# Patient Record
Sex: Male | Born: 1995 | Race: Black or African American | Hispanic: No | Marital: Single | Smoking: Former smoker
Health system: Southern US, Community
[De-identification: ages and names within clinical notes are randomized; demographics above are authoritative.]

---
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
## 2016-10-15 ENCOUNTER — Emergency Department
Admission: EM | Admit: 2016-10-15 | Discharge: 2016-10-15 | Disposition: A | Discharge status: Home / Self Care | Payer: Self-pay | Attending: Emergency Medicine | Admitting: Emergency Medicine

## 2016-10-15 DIAGNOSIS — J111 Influenza due to unidentified influenza virus with other respiratory manifestations: Secondary | ICD-10-CM | POA: Insufficient documentation

## 2016-10-15 MED ORDER — GUAIFENESIN-CODEINE 100-10 MG/5ML PO SOLN: 10.0000 mL | ORAL | 0 refills | Status: DC | PRN

## 2016-10-15 MED ORDER — IBUPROFEN 600 MG PO TABS: 600.0000 mg | ORAL_TABLET | Freq: Four times a day (QID) | ORAL | 0 refills | Status: DC | PRN

## 2016-10-15 MED ORDER — OSELTAMIVIR PHOSPHATE 75 MG PO CAPS: 75.0000 mg | ORAL_CAPSULE | Freq: Two times a day (BID) | ORAL | 0 refills | Status: DC

## 2016-10-15 MED ORDER — ACETAMINOPHEN 325 MG PO TABS
650.0000 mg | ORAL_TABLET | Freq: Once | ORAL | Status: AC | PRN
  Administered 2016-10-15: 650 mg via ORAL
  Filled 2016-10-15: qty 2

## 2016-10-15 MED ORDER — CHLORPHENIRAMINE MALEATE 4 MG PO TABS: 4.0000 mg | ORAL_TABLET | Freq: Two times a day (BID) | ORAL | 0 refills | Status: DC | PRN

## 2016-10-15 NOTE — ED Notes (Signed)
Pt verbalized understanding of discharge instructions. NAD at this time. 

## 2016-10-15 NOTE — ED Provider Notes (Signed)
[facility] Emergency Department Provider Note  ____________________________________________   None    (approximate)  I have reviewed the triage vital signs and the nursing notes.   HISTORY  Chief Complaint Cough and Fatigue   HPI Ethan Dennis is a 21 y.o. male, NAD, who presents with a one day history of cough, chills, and fatigue. These symptoms developed last night, in addition to a headache, body aches, dizziness after coughing, and decreased appetite. He has also felt congested for several days. He is has taken nothing for his symptoms. He works in a [employer] where people have been sick, and his friend has been diagnosed with the flu. He did not receive a flu vaccine this year. He denies changes in vision, shortness of breath, difficulty breathing, chest pain, abdominal pain, nausea, vomiting, diarrhea.  History reviewed. No pertinent past medical history.  There are no active problems to display for this patient.   History reviewed. No pertinent surgical history.  Prior to Admission medications   Medication Sig Start Date End Date Taking? Authorizing Provider  chlorpheniramine (CHLOR-TRIMETON) 4 MG tablet Take 1 tablet (4 mg total) by mouth 2 (two) times daily as needed for allergies or rhinitis. 10/15/16   Charmayne Sheerharles M Ameila Weldon, PA-C  guaiFENesin-codeine 100-10 MG/5ML syrup Take 10 mLs by mouth every 4 (four) hours as needed for cough. 10/15/16   Charmayne Sheerharles M Darrelle Barrell, PA-C  ibuprofen (ADVIL,MOTRIN) 600 MG tablet Take 1 tablet (600 mg total) by mouth every 6 (six) hours as needed. 10/15/16   Evangeline Dakinharles M Vang Kraeger, PA-C  oseltamivir (TAMIFLU) 75 MG capsule Take 1 capsule (75 mg total) by mouth 2 (two) times daily. 10/15/16   Evangeline Dakinharles M Xuan Mateus, PA-C    Allergies Patient has no known allergies.  No family history on file.  Social History Social History  Substance Use Topics  . Smoking status: Never Smoker  . Smokeless tobacco: Never Used  . Alcohol use No     Review of Systems  Constitutional: Positive fever, chills, fatigue, decreased appetite.   Eyes: No visual changes. ENT: Positive nasal congestion, left ear pain. No sore throat, ear drainage. Cardiovascular: Denies chest pain. Respiratory: Positive cough, productive of scant, clear mucus. Denies shortness of breath, chest congestion, wheezing. Gastrointestinal: No abdominal pain.  No nausea, no vomiting.  No diarrhea.  No constipation. Genitourinary: Negative for dysuria. Musculoskeletal: Positive for general myalgias. Skin: Negative for rash. Neurological: Positive for frontal headache. Negative for focal weakness or numbness. 10-point ROS otherwise negative.  ____________________________________________   PHYSICAL EXAM:  VITAL SIGNS: ED Triage Vitals  Enc Vitals Group     BP 10/15/16 1453 (!) 162/89     Pulse Rate 10/15/16 1453 99     Resp 10/15/16 1453 18     Temp 10/15/16 1453 (!) 101.1 F (38.4 C)     Temp Source 10/15/16 1453 Oral     SpO2 10/15/16 1453 96 %     Weight 10/15/16 1453 185 lb (83.9 kg)     Height 10/15/16 1453 6' (1.829 m)     Head Circumference --      Peak Flow --      Pain Score 10/15/16 1454 8     Pain Loc --      Pain Edu? --      Excl. in GC? --    Constitutional: Alert and oriented. Well appearing and in no acute distress. Eyes: Conjunctivae are normal and moist. Eyes without icterus or injection. Ears: R ear with  excessive cerumen without impaction. Portion of TM visible is without erythema. L TM visualized and bulging with injection and erythema. Negative for perforation.  Head: Atraumatic. Nose: Positive congestion with clear and bloody mucus. Bilateral turbinates are erythematous and injected.  Mouth/Throat: Mucous membranes are moist.  Oropharynx injected and erythematous. Neck: No stridor.  Hematological/Lymphatic/Immunilogical: No cervical lymphadenopathy. Cardiovascular: Normal rate, regular rhythm. Grossly normal heart sounds.   Good peripheral circulation. Respiratory: Normal respiratory effort.  No retractions. Lungs CTAB with no wheeze, rhonchi, or rales. Gastrointestinal: Soft and nontender. No distention. Musculoskeletal: No lower extremity tenderness nor edema.  No joint effusions. Neurologic:  Normal speech and language. No gross focal neurologic deficits are appreciated. No gait instability. Skin:  Skin is warm, moist, and intact. Skin turgor is normal. No rash noted. Psychiatric: Mood and affect are normal. Speech and behavior are normal.  ____________________________________________   LABS (all labs ordered are listed, but only abnormal results are displayed)  Labs Reviewed - No data to display __________________________________    PROCEDURES  Procedure(s) performed: None  Procedures  Critical Care performed: No  ____________________________________________   INITIAL IMPRESSION / ASSESSMENT AND PLAN / ED COURSE  Pertinent labs & imaging results that were available during my care of the patient were reviewed by me and considered in my medical decision making (see chart for details).   Influenza-like symptoms. Rx given for chlorpheniramine, guaifenesin-codeine syrup, ibuprofen, oseltamivir. Increase po fluids. No work until fever-free x 24 hours. Follow up with PCP as needed.    ____________________________________________   FINAL CLINICAL IMPRESSION(S) / ED DIAGNOSES  Final diagnoses:  Influenza      NEW MEDICATIONS STARTED DURING THIS VISIT:  Discharge Medication List as of 10/15/2016  4:38 PM    START taking these medications   Details  chlorpheniramine (CHLOR-TRIMETON) 4 MG tablet Take 1 tablet (4 mg total) by mouth 2 (two) times daily as needed for allergies or rhinitis., Starting Sat 10/15/2016, Print    guaiFENesin-codeine 100-10 MG/5ML syrup Take 10 mLs by mouth every 4 (four) hours as needed for cough., Starting Sat 10/15/2016, Print    ibuprofen (ADVIL,MOTRIN) 600 MG  tablet Take 1 tablet (600 mg total) by mouth every 6 (six) hours as needed., Starting Sat 10/15/2016, Print    oseltamivir (TAMIFLU) 75 MG capsule Take 1 capsule (75 mg total) by mouth 2 (two) times daily., Starting Sat 10/15/2016, Print         Note:  This document was prepared using Dragon voice recognition software and may include unintentional dictation errors.   Evangeline Dakin, PA-C 10/15/16 2345    Jene Every, MD 10/17/16 [phone]

## 2016-10-15 NOTE — ED Notes (Signed)
Pt reports chills, sweats and feet aches x 2 days, Pt also reports productive cough with mucus/ Denies sick contacts and states he did not get a flu shot.

## 2016-10-15 NOTE — ED Triage Notes (Signed)
Pt reports nonproductive cough, chills, and fatigue that began last night. Pt provided mask. Pt alert and oriented X4, active, cooperative, pt in NAD. RR even and unlabored, color WNL.

## 2017-03-17 ENCOUNTER — Emergency Department
Admission: EM | Admit: 2017-03-17 | Discharge: 2017-03-17 | Disposition: A | Discharge status: Home / Self Care | Payer: Self-pay | Attending: Emergency Medicine | Admitting: Emergency Medicine

## 2017-03-17 ENCOUNTER — Emergency Department: Payer: Self-pay

## 2017-03-17 DIAGNOSIS — R519 Headache, unspecified: Secondary | ICD-10-CM

## 2017-03-17 DIAGNOSIS — Z79899 Other long term (current) drug therapy: Secondary | ICD-10-CM | POA: Insufficient documentation

## 2017-03-17 DIAGNOSIS — R51 Headache: Secondary | ICD-10-CM | POA: Insufficient documentation

## 2017-03-17 MED ORDER — DIPHENHYDRAMINE HCL 50 MG/ML IJ SOLN
25.0000 mg | Freq: Once | INTRAMUSCULAR | Status: AC
  Administered 2017-03-17: 25 mg via INTRAVENOUS
  Filled 2017-03-17: qty 1

## 2017-03-17 MED ORDER — METOCLOPRAMIDE HCL 5 MG/ML IJ SOLN
20.0000 mg | Freq: Once | INTRAVENOUS | Status: AC
  Administered 2017-03-17: 20 mg via INTRAVENOUS
  Filled 2017-03-17 (×3): qty 4

## 2017-03-17 MED ORDER — KETOROLAC TROMETHAMINE 30 MG/ML IJ SOLN
30.0000 mg | Freq: Once | INTRAMUSCULAR | Status: AC
  Administered 2017-03-17: 30 mg via INTRAVENOUS
  Filled 2017-03-17: qty 1

## 2017-03-17 MED ORDER — BUTALBITAL-APAP-CAFFEINE 50-325-40 MG PO TABS: 1.0000 | ORAL_TABLET | Freq: Four times a day (QID) | ORAL | 0 refills | Status: DC | PRN

## 2017-03-17 MED ORDER — ONDANSETRON HCL 4 MG/2ML IJ SOLN
4.0000 mg | Freq: Once | INTRAMUSCULAR | Status: AC
Start: 2017-03-17 — End: 2017-03-17
  Administered 2017-03-17: 4 mg via INTRAVENOUS
  Filled 2017-03-17: qty 2

## 2017-03-17 MED ORDER — SODIUM CHLORIDE 0.9 % IV BOLUS (SEPSIS)
1000.0000 mL | Freq: Once | INTRAVENOUS | Status: AC
  Administered 2017-03-17: 1000 mL via INTRAVENOUS

## 2017-03-17 NOTE — ED Provider Notes (Signed)
[facility] Emergency Department Provider Note   ____________________________________________   First MD Initiated Contact with Patient 03/17/17 1146     (approximate)  I have reviewed the triage vital signs and the nursing notes.   HISTORY  Chief Complaint Headache    HPI Ethan Dennis is a 21 y.o. male patient complaining of acute headache quite work today. Patient state there is vertigo associated with the headache. Patient denies  facial weakness but is sensitive to light.. Patient has been a long time since he had headache. This  is the worse he ever experienced.Patient's rate headaches a 9/10. Patient describes his headache as "sharp". No palliative management this complaint.   History reviewed. No pertinent past medical history.  There are no active problems to display for this patient.   History reviewed. No pertinent surgical history.  Prior to Admission medications   Medication Sig Start Date End Date Taking? Authorizing Provider  butalbital-acetaminophen-caffeine (FIORICET, ESGIC) [phone] MG tablet Take 1-2 tablets by mouth every 6 (six) hours as needed for headache. 03/17/17   Joni Reining, PA-C  chlorpheniramine (CHLOR-TRIMETON) 4 MG tablet Take 1 tablet (4 mg total) by mouth 2 (two) times daily as needed for allergies or rhinitis. 10/15/16   Beers, Charmayne Sheer, PA-C  guaiFENesin-codeine 100-10 MG/5ML syrup Take 10 mLs by mouth every 4 (four) hours as needed for cough. 10/15/16   Beers, Charmayne Sheer, PA-C  ibuprofen (ADVIL,MOTRIN) 600 MG tablet Take 1 tablet (600 mg total) by mouth every 6 (six) hours as needed. 10/15/16   Beers, Charmayne Sheer, PA-C  oseltamivir (TAMIFLU) 75 MG capsule Take 1 capsule (75 mg total) by mouth 2 (two) times daily. 10/15/16   Beers, Charmayne Sheer, PA-C    Allergies Patient has no known allergies.  No family history on file.  Social History Social History  Substance Use Topics  . Smoking status: Never Smoker    . Smokeless tobacco: Never Used  . Alcohol use No    Review of Systems  Constitutional: No fever/chills Eyes: No visual changes. Light sensitivity ENT: No sore throat. Cardiovascular: Denies chest pain. Respiratory: Denies shortness of breath. Gastrointestinal: No abdominal pain.  Nausea without vomiting.  No diarrhea.  No constipation. Genitourinary: Negative for dysuria. Musculoskeletal: Negative for back pain. Skin: Negative for rash. Neurological: Positive for headaches, but denies focal weakness or numbness.   ____________________________________________   PHYSICAL EXAM:  VITAL SIGNS: ED Triage Vitals  Enc Vitals Group     BP 03/17/17 1115 (!) 161/98     Pulse Rate 03/17/17 1115 62     Resp 03/17/17 1115 18     Temp 03/17/17 1115 98.4 F (36.9 C)     Temp Source 03/17/17 1115 Oral     SpO2 03/17/17 1115 97 %     Weight 03/17/17 1116 180 lb (81.6 kg)     Height 03/17/17 1116 6' (1.829 m)     Head Circumference --      Peak Flow --      Pain Score 03/17/17 1115 9     Pain Loc --      Pain Edu? --      Excl. in GC? --     Constitutional: Alert and oriented. Well appearing and in no acute distress. Eyes: Photophobic Head: Atraumatic. Nose: No congestion/rhinnorhea. No guarding palpation of maxillary sinuses Mouth/Throat: Mucous membranes are moist.  Oropharynx non-erythematous. Bilateral cerumen Neck: No stridor.  No cervical spine tenderness to palpation. Hematological/Lymphatic/Immunilogical: No cervical lymphadenopathy.  Cardiovascular: Normal rate, regular rhythm. Grossly normal heart sounds.  Good peripheral circulation. Elevated blood pressure Respiratory: Normal respiratory effort.  No retractions. Lungs CTAB. Gastrointestinal: Soft and nontender. No distention. No abdominal bruits. No CVA tenderness. Neurologic:  Normal speech and language. No gross focal neurologic deficits are appreciated. No gait instability. Skin:  Skin is warm, dry and intact. No  rash noted. Psychiatric: Mood and affect are normal. Speech and behavior are normal.  ____________________________________________   LABS (all labs ordered are listed, but only abnormal results are displayed)  Labs Reviewed - No data to display ____________________________________________  EKG   ____________________________________________  RADIOLOGY  Ct Head Wo Contrast  Result Date: 03/17/2017 CLINICAL DATA:  Acute onset severe headache. EXAM: CT HEAD WITHOUT CONTRAST TECHNIQUE: Contiguous axial images were obtained from the base of the skull through the vertex without intravenous contrast. COMPARISON:  None. FINDINGS: Brain: The ventricles are normal in size and configuration. There is no intracranial mass, hemorrhage, extra-axial fluid collection, or midline shift. Gray-white compartments are normal. No acute infarct is appreciable. Vascular: There is no hyperdense vessel. There is no appreciable vascular calcification. Skull: Bony calvarium appears intact. Sinuses/Orbits: There is mucosal thickening and opacity in several ethmoid air cells bilaterally. Other visualized paranasal sinuses are clear. Frontal sinuses are hypoplastic. Visualized orbits appear symmetric bilaterally. Other: Mastoid air cells are clear. There is debris in each external auditory canal, more on the right than on the left. IMPRESSION: No intracranial mass, hemorrhage, or extra-axial fluid collection. Gray-white compartments appear normal. Ethmoid sinus disease bilaterally. Probable cerumen in each external auditory canal. Electronically Signed   By: Bretta BangWilliam  Woodruff III M.D.   On: 03/17/2017 12:22    _Head CT with no acute findings. Patient does have ethmoid sinusitis. ___________________________________________   PROCEDURES  Procedure(s) performed: None  Procedures  Critical Care performed: No  ____________________________________________   INITIAL IMPRESSION / ASSESSMENT AND PLAN / ED  COURSE  Pertinent labs & imaging results that were available during my care of the patient were reviewed by me and considered in my medical decision making (see chart for details).  Acute headache. Discussed CT findings with patient. Patient given IV Toradol, normal saline, Zofran, and Reglan. Status post intervention patient state headache approximately 90% resolved. Patient [facility]. Patient given work note for today.      ____________________________________________   FINAL CLINICAL IMPRESSION(S) / ED DIAGNOSES  Final diagnoses:  Headache disorder      NEW MEDICATIONS STARTED DURING THIS VISIT:  New Prescriptions   BUTALBITAL-ACETAMINOPHEN-CAFFEINE (FIORICET, ESGIC) 50-325-40 MG TABLET    Take 1-2 tablets by mouth every 6 (six) hours as needed for headache.     Note:  This document was prepared using Dragon voice recognition software and may include unintentional dictation errors.    Joni ReiningSmith, Ronald K, PA-C 03/17/17 1357    Sharyn CreamerQuale, Mark, MD 03/17/17 1710

## 2017-03-17 NOTE — ED Notes (Signed)
See triage note  Presents with sudden onset of headache   States he developed headache while at work this a,

## 2017-03-17 NOTE — ED Triage Notes (Signed)
Pt states he was at work today and had sudden onset HA . States he has hx of Ethan Dennis but it has been a long time. Denies taking any medication to relieve the pain.

## 2017-11-03 ENCOUNTER — Other Ambulatory Visit: Payer: Self-pay

## 2017-11-03 ENCOUNTER — Emergency Department: Payer: Self-pay

## 2017-11-03 ENCOUNTER — Encounter: Payer: Self-pay | Admitting: Emergency Medicine

## 2017-11-03 ENCOUNTER — Emergency Department
Admission: EM | Admit: 2017-11-03 | Discharge: 2017-11-03 | Disposition: A | Discharge status: Home / Self Care | Payer: Self-pay | Attending: Emergency Medicine | Admitting: Emergency Medicine

## 2017-11-03 DIAGNOSIS — W010XXA Fall on same level from slipping, tripping and stumbling without subsequent striking against object, initial encounter: Secondary | ICD-10-CM | POA: Insufficient documentation

## 2017-11-03 DIAGNOSIS — S63502A Unspecified sprain of left wrist, initial encounter: Secondary | ICD-10-CM | POA: Insufficient documentation

## 2017-11-03 DIAGNOSIS — F17210 Nicotine dependence, cigarettes, uncomplicated: Secondary | ICD-10-CM | POA: Insufficient documentation

## 2017-11-03 DIAGNOSIS — Y9367 Activity, basketball: Secondary | ICD-10-CM | POA: Insufficient documentation

## 2017-11-03 DIAGNOSIS — Y999 Unspecified external cause status: Secondary | ICD-10-CM | POA: Insufficient documentation

## 2017-11-03 DIAGNOSIS — Y929 Unspecified place or not applicable: Secondary | ICD-10-CM | POA: Insufficient documentation

## 2017-11-03 DIAGNOSIS — Z79899 Other long term (current) drug therapy: Secondary | ICD-10-CM | POA: Insufficient documentation

## 2017-11-03 MED ORDER — MELOXICAM 15 MG PO TABS: 15.0000 mg | ORAL_TABLET | Freq: Every day | ORAL | 0 refills | Status: DC

## 2017-11-03 NOTE — ED Triage Notes (Signed)
Pt reports was playing basketball and fell on left hand, reports pain to left wrist

## 2017-11-03 NOTE — ED Provider Notes (Signed)
[facility] Emergency Department Provider Note  ____________________________________________  Time seen: Approximately 10:59 PM  I have reviewed the triage vital signs and the nursing notes.   HISTORY  Chief Complaint Wrist Pain    HPI Ethan Dennis is a 22 y.o. male who presents the emergency department complaining of left wrist pain.  Patient was playing basketball, when he fell onto an outstretched left hand.  Patient is reporting the majority of the pain is in the anterior aspect of the left wrist.  Patient still has full range of motion to the wrist.  No numbness or tingling into the digits.  No pain complaints in the hands or fingers.  Patient did not hit his head or lose consciousness.  No medications for this complaint prior to arrival.  No other injury or complaint at this time.  History reviewed. No pertinent past medical history.  There are no active problems to display for this patient.   History reviewed. No pertinent surgical history.  Prior to Admission medications   Medication Sig Start Date End Date Taking? Authorizing Provider  butalbital-acetaminophen-caffeine (FIORICET, ESGIC) [phone] MG tablet Take 1-2 tablets by mouth every 6 (six) hours as needed for headache. 03/17/17   Joni Reining, PA-C  chlorpheniramine (CHLOR-TRIMETON) 4 MG tablet Take 1 tablet (4 mg total) by mouth 2 (two) times daily as needed for allergies or rhinitis. 10/15/16   Beers, Charmayne Sheer, PA-C  guaiFENesin-codeine 100-10 MG/5ML syrup Take 10 mLs by mouth every 4 (four) hours as needed for cough. 10/15/16   Beers, Charmayne Sheer, PA-C  ibuprofen (ADVIL,MOTRIN) 600 MG tablet Take 1 tablet (600 mg total) by mouth every 6 (six) hours as needed. 10/15/16   Beers, Charmayne Sheer, PA-C  meloxicam (MOBIC) 15 MG tablet Take 1 tablet (15 mg total) by mouth daily. 11/03/17   Cuthriell, Delorise Royals, PA-C  oseltamivir (TAMIFLU) 75 MG capsule Take 1 capsule (75 mg total) by mouth 2 (two)  times daily. 10/15/16   Beers, Charmayne Sheer, PA-C    Allergies Patient has no known allergies.  No family history on file.  Social History Social History   Tobacco Use  . Smoking status: Current Some Day Smoker  . Smokeless tobacco: Never Used  Substance Use Topics  . Alcohol use: No  . Drug use: No     Review of Systems  Constitutional: No fever/chills Eyes: No visual changes.  Cardiovascular: no chest pain. Respiratory: no cough. No SOB. Gastrointestinal: No abdominal pain.  No nausea, no vomiting.   Musculoskeletal: Positive for left wrist injury/pain Skin: Negative for rash, abrasions, lacerations, ecchymosis. Neurological: Negative for headaches, focal weakness or numbness. 10-point ROS otherwise negative.  ____________________________________________   PHYSICAL EXAM:  VITAL SIGNS: ED Triage Vitals [11/03/17 2224]  Enc Vitals Group     BP (!) 141/96     Pulse Rate 87     Resp 20     Temp 99.6 F (37.6 C)     Temp Source Oral     SpO2 97 %     Weight 170 lb (77.1 kg)     Height 6' (1.829 m)     Head Circumference      Peak Flow      Pain Score 10     Pain Loc      Pain Edu?      Excl. in GC?      Constitutional: Alert and oriented. Well appearing and in no acute distress. Eyes: Conjunctivae are normal. PERRL.  EOMI. Head: Atraumatic. Neck: No stridor.    Cardiovascular: Normal rate, regular rhythm. Normal S1 and S2.  Good peripheral circulation. Respiratory: Normal respiratory effort without tachypnea or retractions. Lungs CTAB. Good air entry to the bases with no decreased or absent breath sounds. Musculoskeletal: Full range of motion to all extremities. No gross deformities appreciated.  No visible deformity, abrasion, laceration, ecchymosis noted to left wrist.  Full range of motion to the wrist.  Patient is tender to palpation over the anterior knee.  Majority tenderness is over the distal radius.  No other tenderness to palpation over the osseous  structures and sensation and capillary refill intact all 5 digits.  Examination of the elbow and shoulder is unremarkable. Neurologic:  Normal speech and language. No gross focal neurologic deficits are appreciated.  Skin:  Skin is warm, dry and intact. No rash noted. Psychiatric: Mood and affect are normal. Speech and behavior are normal. Patient exhibits appropriate insight and judgement.   ____________________________________________   LABS (all labs ordered are listed, but only abnormal results are displayed)  Labs Reviewed - No data to display ____________________________________________  EKG   ____________________________________________  RADIOLOGY Festus BarrenI, Jonathan D Cuthriell, personally viewed and evaluated these images (plain radiographs) as part of my medical decision making, as well as reviewing the written report by the radiologist.  I concur with radiologist finding of no acute osseous abnormality in the left wrist.  Dg Wrist Complete Left  Result Date: 11/03/2017 CLINICAL DATA:  Basketball injury, falling on the wrist with subsequent pain. EXAM: LEFT WRIST - COMPLETE 3+ VIEW COMPARISON:  06/13/2011. FINDINGS: There is no evidence of fracture or dislocation. There is no evidence of arthropathy or other focal bone abnormality. Soft tissues are unremarkable. IMPRESSION: Normal radiographs. Electronically Signed   By: Paulina FusiMark  Shogry M.D.   On: 11/03/2017 22:50    ____________________________________________    PROCEDURES  Procedure(s) performed:    .Splint Application Date/Time: 11/03/2017 11:02 PM Performed by: Racheal Patchesuthriell, Jonathan D, PA-C Authorized by: Racheal Patchesuthriell, Jonathan D, PA-C   Consent:    Consent obtained:  Verbal   Consent given by:  Patient   Risks discussed:  Pain and swelling Pre-procedure details:    Sensation:  Normal Procedure details:    Laterality:  Left   Location:  Wrist   Splint type:  Wrist   Supplies:  Prefabricated splint Post-procedure  details:    Pain:  Improved   Sensation:  Normal   Patient tolerance of procedure:  Tolerated well, no immediate complications      Medications - No data to display   ____________________________________________   INITIAL IMPRESSION / ASSESSMENT AND PLAN / ED COURSE  Pertinent labs & imaging results that were available during my care of the patient were reviewed by me and considered in my medical decision making (see chart for details).  Review of the [place] CSRS was performed in accordance of the [facility] prior to dispensing any controlled drugs.     Patient's diagnosis is consistent with left wrist sprain.  Patient's initial differential location versus sprain versus contusion.  X-ray reveals no acute osseous abnormalities.  Exam is reassuring.  No indication for further workup.  Patient was given Velcro wrist brace for symptom improvement.. Patient will be discharged home with prescriptions for meloxicam. Patient is to follow up with orthopedics as needed or otherwise directed. Patient is given ED precautions to return to the ED for any worsening or new symptoms.     ____________________________________________  FINAL CLINICAL IMPRESSION(S) / ED  DIAGNOSES  Final diagnoses:  Sprain of left wrist, initial encounter      NEW MEDICATIONS STARTED DURING THIS VISIT:  ED Discharge Orders        Ordered    meloxicam (MOBIC) 15 MG tablet  Daily     11/03/17 2300          This chart was dictated using voice recognition software/Dragon. Despite best efforts to proofread, errors can occur which can change the meaning. Any change was purely unintentional.    Racheal Patches, PA-C 11/03/17 2303    Rockne Menghini, MD 11/03/17 [phone]

## 2017-11-03 NOTE — ED Notes (Signed)
Pt states that he bent his left wrist backwards during a basketball game today. Family at bedside.

## 2018-06-13 ENCOUNTER — Encounter: Payer: Self-pay | Admitting: Emergency Medicine

## 2018-06-13 ENCOUNTER — Emergency Department: Payer: Self-pay

## 2018-06-13 ENCOUNTER — Other Ambulatory Visit: Payer: Self-pay

## 2018-06-13 ENCOUNTER — Emergency Department
Admission: EM | Admit: 2018-06-13 | Discharge: 2018-06-13 | Disposition: A | Discharge status: Home / Self Care | Payer: Self-pay | Attending: Emergency Medicine | Admitting: Emergency Medicine

## 2018-06-13 DIAGNOSIS — Z79899 Other long term (current) drug therapy: Secondary | ICD-10-CM | POA: Insufficient documentation

## 2018-06-13 DIAGNOSIS — F17200 Nicotine dependence, unspecified, uncomplicated: Secondary | ICD-10-CM | POA: Insufficient documentation

## 2018-06-13 DIAGNOSIS — R0789 Other chest pain: Secondary | ICD-10-CM | POA: Insufficient documentation

## 2018-06-13 LAB — FIBRIN DERIVATIVES D-DIMER (ARMC ONLY): Fibrin derivatives D-dimer (ARMC): 105.78 ng/mL (FEU) (ref 0.00–499.00)

## 2018-06-13 LAB — COMPREHENSIVE METABOLIC PANEL
ALBUMIN: 4.4 g/dL (ref 3.5–5.0)
ALT: 28 U/L (ref 0–44)
ANION GAP: 10 (ref 5–15)
AST: 27 U/L (ref 15–41)
Alkaline Phosphatase: 73 U/L (ref 38–126)
BILIRUBIN TOTAL: 0.6 mg/dL (ref 0.3–1.2)
BUN: 15 mg/dL (ref 6–20)
CHLORIDE: 104 mmol/L (ref 98–111)
CO2: 28 mmol/L (ref 22–32)
Calcium: 9.4 mg/dL (ref 8.9–10.3)
Creatinine, Ser: 0.88 mg/dL (ref 0.61–1.24)
GFR calc Af Amer: >60 mL/min (ref >60)
GFR calc non Af Amer: >60 mL/min (ref >60)
GLUCOSE: 111 mg/dL — AB (ref 70–99)
POTASSIUM: 3.9 mmol/L (ref 3.5–5.1)
SODIUM: 142 mmol/L (ref 135–145)
TOTAL PROTEIN: 7.7 g/dL (ref 6.5–8.1)

## 2018-06-13 LAB — CBC
HCT: 44.3 % (ref 40.0–52.0)
HEMOGLOBIN: 15.1 g/dL (ref 13.0–18.0)
MCH: 29.9 pg (ref 26.0–34.0)
MCHC: 34.1 g/dL (ref 32.0–36.0)
MCV: 87.4 fL (ref 80.0–100.0)
PLATELETS: 187 10*3/uL (ref 150–440)
RBC: 5.07 MIL/uL (ref 4.40–5.90)
RDW: 13.7 % (ref 11.5–14.5)
WBC: 6.1 10*3/uL (ref 3.8–10.6)

## 2018-06-13 LAB — TROPONIN I: Troponin I: <0.03 ng/mL (ref <0.03)

## 2018-06-13 MED ORDER — KETOROLAC TROMETHAMINE 30 MG/ML IJ SOLN
30.0000 mg | Freq: Once | INTRAMUSCULAR | Status: AC
  Administered 2018-06-13: 30 mg via INTRAVENOUS
  Filled 2018-06-13: qty 1

## 2018-06-13 MED ORDER — NAPROXEN 500 MG PO TABS: 500.0000 mg | ORAL_TABLET | Freq: Two times a day (BID) | ORAL | 2 refills | Status: DC

## 2018-06-13 NOTE — ED Provider Notes (Signed)
[facility] Emergency Department Provider Note   ____________________________________________    I have reviewed the triage vital signs and the nursing notes.   HISTORY  Chief Complaint Chest Pain     HPI Ethan Dennis is a 22 y.o. male who presents with complaints of chest pain.  Patient reports he woke up this morning felt "hot ".  Went to the bathroom and laid on the floor, had to vomit and felt somewhat better after that, went back to bed for a brief period of time and developed some tightness in the right side of his chest.  He went to work at 7 AM and reports tightness increased somewhat.  Reports it is worse if he sits up, worse with palpation.  Apparently went to the hospital in the past with a muscle spasm in his chest.  Denies drug use.  No shortness of breath.  No recent travel.  URI 2 weeks ago   History reviewed. No pertinent past medical history.  There are no active problems to display for this patient.   History reviewed. No pertinent surgical history.  Prior to Admission medications   Medication Sig Start Date End Date Taking? Authorizing Provider  butalbital-acetaminophen-caffeine (FIORICET, ESGIC) [phone] MG tablet Take 1-2 tablets by mouth every 6 (six) hours as needed for headache. 03/17/17   Joni Reining, PA-C  chlorpheniramine (CHLOR-TRIMETON) 4 MG tablet Take 1 tablet (4 mg total) by mouth 2 (two) times daily as needed for allergies or rhinitis. 10/15/16   Beers, Charmayne Sheer, PA-C  guaiFENesin-codeine 100-10 MG/5ML syrup Take 10 mLs by mouth every 4 (four) hours as needed for cough. 10/15/16   Beers, Charmayne Sheer, PA-C  ibuprofen (ADVIL,MOTRIN) 600 MG tablet Take 1 tablet (600 mg total) by mouth every 6 (six) hours as needed. 10/15/16   Beers, Charmayne Sheer, PA-C  meloxicam (MOBIC) 15 MG tablet Take 1 tablet (15 mg total) by mouth daily. 11/03/17   Cuthriell, Delorise Royals, PA-C  naproxen (NAPROSYN) 500 MG tablet Take 1 tablet (500 mg  total) by mouth 2 (two) times daily with a meal. 06/13/18   Jene Every, MD  oseltamivir (TAMIFLU) 75 MG capsule Take 1 capsule (75 mg total) by mouth 2 (two) times daily. 10/15/16   Beers, Charmayne Sheer, PA-C     Allergies Patient has no known allergies.  History reviewed. No pertinent family history.  Social History Social History   Tobacco Use  . Smoking status: Current Some Day Smoker  . Smokeless tobacco: Never Used  Substance Use Topics  . Alcohol use: No  . Drug use: No    Review of Systems  Constitutional: No fever/chills Eyes: No visual changes.  ENT: No sore throat. Cardiovascular: As above Respiratory: Denies shortness of breath. Gastrointestinal: No abdominal pain.  Vomiting as above Genitourinary: Negative for dysuria. Musculoskeletal: Negative for back pain. Skin: Negative for rash. Neurological: Negative for headaches    ____________________________________________   PHYSICAL EXAM:  VITAL SIGNS: ED Triage Vitals  Enc Vitals Group     BP 06/13/18 0900 (!) 154/108     Pulse Rate 06/13/18 0900 65     Resp 06/13/18 0859 16     Temp 06/13/18 0859 98.4 F (36.9 C)     Temp Source 06/13/18 0859 Oral     SpO2 --      Weight 06/13/18 0902 102.1 kg (225 lb)     Height 06/13/18 0902 1.829 m (6')     Head Circumference --  Peak Flow --      Pain Score 06/13/18 0901 8     Pain Loc --      Pain Edu? --      Excl. in GC? --     Constitutional: Alert and oriented. No acute distress. Pleasant and interactive  Nose: No congestion/rhinnorhea. Mouth/Throat: Mucous membranes are moist.    Cardiovascular: Normal rate, regular rhythm. Grossly normal heart sounds.  Good peripheral circulation.  Significant tenderness palpation over the right pectoralis muscle, no rash Respiratory: Normal respiratory effort.  No retractions. Lungs CTAB. Gastrointestinal: Soft and nontender. No distention.    Musculoskeletal: No lower extremity tenderness nor edema.  Warm and  well perfused Neurologic:  Normal speech and language. No gross focal neurologic deficits are appreciated.  Skin:  Skin is warm, dry and intact. No rash noted. Psychiatric: Mood and affect are normal. Speech and behavior are normal.  ____________________________________________   LABS (all labs ordered are listed, but only abnormal results are displayed)  Labs Reviewed  COMPREHENSIVE METABOLIC PANEL - Abnormal; Notable for the following components:      Result Value   Glucose, Bld 111 (*)    All other components within normal limits  CBC  TROPONIN I  FIBRIN DERIVATIVES D-DIMER ([facility] ONLY)  TROPONIN I   ____________________________________________  EKG  ED ECG REPORT I, Jene Everyobert Simya Tercero, the attending physician, personally viewed and interpreted this ECG.  Date: 06/13/2018  Rhythm: normal sinus rhythm QRS Axis: normal Intervals: normal ST/T Wave abnormalities: ST elevation anteriorly is chronic, seen in EKG from 2011 Narrative Interpretation: no evidence of acute ischemia  ____________________________________________  RADIOLOGY  Chest x-ray normal ____________________________________________   PROCEDURES  Procedure(s) performed: No  Procedures   Critical Care performed:No ____________________________________________   INITIAL IMPRESSION / ASSESSMENT AND PLAN / ED COURSE  Pertinent labs & imaging results that were available during my care of the patient were reviewed by me and considered in my medical decision making (see chart for details).  Patient is a 22 year old man, low risk for ACS, differential includes myocarditis, or muscle spasm/chest wall pain.  EKG not consistent with ACS or pericarditis, will check labs, including d-dimer.   Patient second troponin is normal.  After Toradol the patient is pain-free and feels quite well.  Appropriate for discharge at this time with follow-up, will prescribe NSAIDs     ____________________________________________   FINAL CLINICAL IMPRESSION(S) / ED DIAGNOSES  Final diagnoses:  Chest wall pain        Note:  This document was prepared using Dragon voice recognition software and may include unintentional dictation errors.    Jene EveryKinner, Soren Pigman, MD 06/13/18 1341

## 2018-06-13 NOTE — ED Triage Notes (Addendum)
Pt arrived via [facility]. Per EMS pt c/o of mid/intermittent CP 8/10 that radiates to R/shoulder; CP started at 0530am. Per EMS pt denies any recent injuries/pulling/pushing heavy objects. Pt was given 324mg  aspirin in route. Per EMS pt feels chest tenderness. Pt denies any SOB, N/V. NAD noted.

## 2018-07-31 ENCOUNTER — Other Ambulatory Visit: Payer: Self-pay

## 2018-07-31 ENCOUNTER — Encounter: Payer: Self-pay | Admitting: Emergency Medicine

## 2018-07-31 ENCOUNTER — Emergency Department
Admission: EM | Admit: 2018-07-31 | Discharge: 2018-07-31 | Disposition: A | Discharge status: Home / Self Care | Payer: Self-pay | Attending: Emergency Medicine | Admitting: Emergency Medicine

## 2018-07-31 DIAGNOSIS — A084 Viral intestinal infection, unspecified: Secondary | ICD-10-CM | POA: Insufficient documentation

## 2018-07-31 NOTE — ED Provider Notes (Signed)
[facility] Emergency Department Provider Note  ____________________________________________   None    (approximate)  I have reviewed the triage vital signs and the nursing notes.   HISTORY  Chief Complaint Generalized Body Aches   HPI Ethan Dennis is a 22 y.o. male presents to the ED with complaint of sudden onset of body aches, vomiting x2, diarrhea x1 early this morning.  He states currently he no longer feels nauseous.  He did not take his temperature.  He has not taken any over-the-counter medication.  He states he was not able to go to work today.  Patient rates his pain as an 8 out of 10.  History reviewed. No pertinent past medical history.  There are no active problems to display for this patient.   History reviewed. No pertinent surgical history.  Prior to Admission medications   Not on File    Allergies Patient has no known allergies.  No family history on file.  Social History Social History   Tobacco Use  . Smoking status: Former Games developer  . Smokeless tobacco: Never Used  Substance Use Topics  . Alcohol use: No  . Drug use: No    Review of Systems Constitutional: Subjective fever/chills Eyes: No visual changes. ENT: No sore throat. Cardiovascular: Denies chest pain. Respiratory: Denies shortness of breath. Gastrointestinal: No abdominal pain.  Positive nausea, positive vomiting.  Positive diarrhea.  No constipation. Genitourinary: Negative for dysuria. Musculoskeletal: Positive for muscle aches. Skin: Negative for rash. Neurological: Negative for headaches, focal weakness or numbness. ___________________________________________   PHYSICAL EXAM:  VITAL SIGNS: ED Triage Vitals  Enc Vitals Group     BP 07/31/18 0655 (!) 150/81     Pulse Rate 07/31/18 0655 86     Resp 07/31/18 0655 20     Temp 07/31/18 0655 99.1 F (37.3 C)     Temp Source 07/31/18 0655 Oral     SpO2 07/31/18 0655 99 %     Weight 07/31/18  0654 203 lb (92.1 kg)     Height 07/31/18 0654 6' (1.829 m)     Head Circumference --      Peak Flow --      Pain Score 07/31/18 0654 8     Pain Loc --      Pain Edu? --      Excl. in GC? --     Constitutional: Alert and oriented. Well appearing and in no acute distress. Eyes: Conjunctivae are normal. PERRL. EOMI. Head: Atraumatic. Nose: No congestion/rhinnorhea.  Mouth/Throat: Mucous membranes are moist.  Oropharynx non-erythematous. Neck: No stridor.   Cardiovascular: Normal rate, regular rhythm. Grossly normal heart sounds.  Good peripheral circulation. Respiratory: Normal respiratory effort.  No retractions. Lungs CTAB. Gastrointestinal: Soft and nontender. No distention.  Bowel sounds normoactive x4 quadrants.  No CVA tenderness. Musculoskeletal: His upper and lower extremities that any difficulty and slow guarded gait was noted. Neurologic:  Normal speech and language. No gross focal neurologic deficits are appreciated. No gait instability. Skin:  Skin is warm, dry and intact. No rash noted. Psychiatric: Mood and affect are normal. Speech and behavior are normal.  ____________________________________________   LABS (all labs ordered are listed, but only abnormal results are displayed)  Labs Reviewed - No data to display  PROCEDURES  Procedure(s) performed: None  Procedures  Critical Care performed: No  ____________________________________________   INITIAL IMPRESSION / ASSESSMENT AND PLAN / ED COURSE  As part of my medical decision making, I reviewed the following data  within the electronic MEDICAL RECORD NUMBER Notes from prior ED visits and [place] Controlled Substance Database  Patient presents to the ED with complaint of not feeling well with isolated episode of vomiting and diarrhea this morning.  Patient is to remain on clear liquids the remainder of the day and take Tylenol if needed for body aches or fever.  He was given a note to remain out of work today.  Patient  was given instructions on viral gastroenteritis and that he is contagious.  He is to follow-up with his PCP or [facility] acute care if any continued problems.  ____________________________________________   FINAL CLINICAL IMPRESSION(S) / ED DIAGNOSES  Final diagnoses:  Viral gastroenteritis     ED Discharge Orders    None       Note:  This document was prepared using Dragon voice recognition software and may include unintentional dictation errors.    Tommi Rumps, PA-C 07/31/18 1157    Nita Sickle, MD 08/01/18 [phone]

## 2018-07-31 NOTE — ED Triage Notes (Signed)
Patient ambulatory to triage with steady gait, without difficulty or distress noted, mask in place; pt reports "I think I have the flu"; c/o body aches this morning

## 2018-07-31 NOTE — Discharge Instructions (Addendum)
Follow-up with your primary care provider or [facility] acute care if any continued problems.  Remain on clear liquids the next 24 hours.  Then advance to crackers, plain toast, plain rice, applesauce before advancing her diet.  Tylenol as needed for body aches or fever.

## 2018-07-31 NOTE — ED Notes (Signed)
See triage note  Presents with body aches ,sore throat and cough this am  States he became "hot"  Unsure of fever at home  Low grade temp noted on arrival

## 2018-10-11 ENCOUNTER — Encounter: Payer: Self-pay | Admitting: Emergency Medicine

## 2018-10-11 ENCOUNTER — Other Ambulatory Visit: Payer: Self-pay

## 2018-10-11 ENCOUNTER — Emergency Department
Admission: EM | Admit: 2018-10-11 | Discharge: 2018-10-11 | Disposition: A | Discharge status: Home / Self Care | Payer: Self-pay | Attending: Emergency Medicine | Admitting: Emergency Medicine

## 2018-10-11 DIAGNOSIS — J069 Acute upper respiratory infection, unspecified: Secondary | ICD-10-CM | POA: Insufficient documentation

## 2018-10-11 DIAGNOSIS — F17200 Nicotine dependence, unspecified, uncomplicated: Secondary | ICD-10-CM | POA: Insufficient documentation

## 2018-10-11 DIAGNOSIS — B9789 Other viral agents as the cause of diseases classified elsewhere: Secondary | ICD-10-CM | POA: Insufficient documentation

## 2018-10-11 MED ORDER — PSEUDOEPH-BROMPHEN-DM 30-2-10 MG/5ML PO SYRP: 5.0000 mL | ORAL_SOLUTION | Freq: Four times a day (QID) | ORAL | 0 refills | Status: DC | PRN

## 2018-10-11 NOTE — Discharge Instructions (Signed)
Follow-up with [facility] or doctor of your choice if any continued problems.  Increase fluids.  Decrease or discontinue smoking.  Take Tylenol or ibuprofen if needed for headache, body aches or ear pain.  Begin taking Bromfed-DM 4 times a day as needed for cough and congestion.  You may also use saline nose spray to help with nasal congestion.

## 2018-10-11 NOTE — ED Notes (Signed)
See triage note.

## 2018-10-11 NOTE — ED Triage Notes (Signed)
Cough x 1 week.  Today feels worse.

## 2018-10-11 NOTE — ED Provider Notes (Signed)
[facility] Emergency Department Provider Note  ____________________________________________   First MD Initiated Contact with Patient 10/11/18 0940     (approximate)  I have reviewed the triage vital signs and the nursing notes.   HISTORY  Chief Complaint Influenza   HPI Ethan Dennis is a 23 y.o. male presents to the ED with complaint of cough for 1 week.  Patient is unaware of any fever.  He states that his ears have been congested and that he has had a nonproductive cough.  He states that he felt worse today.  He has taken NyQuil only at night.  He denies any nausea, vomiting or diarrhea.  He continues to smoke daily.  He rates his pain as 6 out of 10.   History reviewed. No pertinent past medical history.  There are no active problems to display for this patient.   History reviewed. No pertinent surgical history.  Prior to Admission medications   Medication Sig Start Date End Date Taking? Authorizing Provider  brompheniramine-pseudoephedrine-DM 30-2-10 MG/5ML syrup Take 5 mLs by mouth 4 (four) times daily as needed. 10/11/18   Tommi Rumps, PA-C    Allergies Patient has no known allergies.  No family history on file.  Social History Social History   Tobacco Use  . Smoking status: Former Games developer  . Smokeless tobacco: Never Used  Substance Use Topics  . Alcohol use: No  . Drug use: No    Review of Systems Constitutional: No fever/chills Eyes: No visual changes. ENT: No sore throat.  Positive for ear pain.  Positive nasal congestion. Cardiovascular: Denies chest pain. Respiratory: Denies shortness of breath.  Positive cough. Gastrointestinal: No abdominal pain.  No nausea, no vomiting.  No diarrhea. Genitourinary: Negative for dysuria. Musculoskeletal: Negative for muscle aches. Skin: Negative for rash. Neurological: Negative for headaches, focal weakness or  numbness. ___________________________________________   PHYSICAL EXAM:  VITAL SIGNS: ED Triage Vitals  Enc Vitals Group     BP 10/11/18 0920 (!) 147/87     Pulse Rate 10/11/18 0920 (!) 56     Resp 10/11/18 0920 16     Temp 10/11/18 0920 98.2 F (36.8 C)     Temp Source 10/11/18 0920 Oral     SpO2 10/11/18 0920 96 %     Weight 10/11/18 0919 205 lb (93 kg)     Height 10/11/18 0919 6\' 1"  (1.854 m)     Head Circumference --      Peak Flow --      Pain Score 10/11/18 0919 6     Pain Loc --      Pain Edu? --      Excl. in GC? --    Constitutional: Alert and oriented. Well appearing and in no acute distress. Eyes: Conjunctivae are normal.  Head: Atraumatic. Nose: Mild congestion/rhinnorhea.  TMs are dull bilaterally but no erythema or injection is noted. Mouth/Throat: Mucous membranes are moist.  Oropharynx non-erythematous.  Mild posterior drainage. Neck: No stridor.   Hematological/Lymphatic/Immunilogical: No cervical lymphadenopathy. Cardiovascular: Normal rate, regular rhythm. Grossly normal heart sounds.  Good peripheral circulation. Respiratory: Normal respiratory effort.  No retractions. Lungs CTAB. Musculoskeletal: No lower extremity tenderness nor edema.  No joint effusions. Neurologic:  Normal speech and language. No gross focal neurologic deficits are appreciated. No gait instability. Skin:  Skin is warm, dry and intact. No rash noted. Psychiatric: Mood and affect are normal. Speech and behavior are normal.  ____________________________________________   LABS (all labs ordered are listed,  but only abnormal results are displayed)  Labs Reviewed - No data to display   PROCEDURES  Procedure(s) performed: None  Procedures  Critical Care performed: No  ____________________________________________   INITIAL IMPRESSION / ASSESSMENT AND PLAN / ED COURSE  As part of my medical decision making, I reviewed the following data within the electronic MEDICAL RECORD NUMBER  Notes from prior ED visits and [place] Controlled Substance Database  Patient presents to the ED with complaint of cough for 1 week.  Patient has had nasal congestion and some diffuse ear pain.  He has been taking some NyQuil at night without complete relief.  He states he feels worse today.  He denies any fever or chills.  He denies any muscle aches or symptoms that suggest influenza.  Patient was given a prescription for Bromfed-DM.  He is encouraged to discontinue smoking and increase fluids.  He is to follow-up with [facility] or urgent care of his choice if any continued problems.  ____________________________________________   FINAL CLINICAL IMPRESSION(S) / ED DIAGNOSES  Final diagnoses:  Viral URI with cough     ED Discharge Orders         Ordered    brompheniramine-pseudoephedrine-DM 30-2-10 MG/5ML syrup  4 times daily PRN     10/11/18 0951           Note:  This document was prepared using Dragon voice recognition software and may include unintentional dictation errors.    Tommi Rumps, PA-C 10/11/18 3267    Emily Filbert, MD 10/11/18 [phone]

## 2018-10-11 NOTE — ED Notes (Signed)
See triage note  States he developed cough a few days ago   But this am while he was at work he states he became weak and "just didn't feel right"  Afebrile on arrival denies any n/v/

## 2019-07-17 ENCOUNTER — Other Ambulatory Visit: Payer: Self-pay | Admitting: *Deleted

## 2019-07-17 DIAGNOSIS — Z20822 Contact with and (suspected) exposure to covid-19: Secondary | ICD-10-CM

## 2019-07-18 LAB — NOVEL CORONAVIRUS, NAA: SARS-CoV-2, NAA: NOT DETECTED

## 2019-08-15 ENCOUNTER — Emergency Department: Payer: Self-pay

## 2019-08-15 ENCOUNTER — Emergency Department
Admission: EM | Admit: 2019-08-15 | Discharge: 2019-08-15 | Disposition: A | Discharge status: Home / Self Care | Payer: Self-pay | Attending: Emergency Medicine | Admitting: Emergency Medicine

## 2019-08-15 ENCOUNTER — Other Ambulatory Visit: Payer: Self-pay

## 2019-08-15 ENCOUNTER — Encounter: Payer: Self-pay | Admitting: Physician Assistant

## 2019-08-15 DIAGNOSIS — M25511 Pain in right shoulder: Secondary | ICD-10-CM | POA: Insufficient documentation

## 2019-08-15 DIAGNOSIS — Z87891 Personal history of nicotine dependence: Secondary | ICD-10-CM | POA: Insufficient documentation

## 2019-08-15 MED ORDER — MELOXICAM 15 MG PO TABS: 15.0000 mg | ORAL_TABLET | Freq: Every day | ORAL | 2 refills | Status: DC

## 2019-08-15 MED ORDER — HYDROCODONE-ACETAMINOPHEN 5-325 MG PO TABS
1.0000 | ORAL_TABLET | Freq: Once | ORAL | Status: AC
  Administered 2019-08-15: 19:00:00 1 via ORAL
  Filled 2019-08-15: qty 1

## 2019-08-15 NOTE — ED Notes (Signed)
Pt transported to xray 

## 2019-08-15 NOTE — ED Notes (Signed)
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

## 2019-08-15 NOTE — ED Triage Notes (Signed)
Pt presents to ED via POV with c/o R shoulder pain, pt states "I pulled my shoulder while I was at work". Pt denies filing as workers comp. Pt c/o pain to R shoulder.

## 2019-08-15 NOTE — ED Provider Notes (Signed)
[facility] Emergency Department Provider Note  ____________________________________________   First MD Initiated Contact with Patient 08/15/19 1847     (approximate)  I have reviewed the triage vital signs and the nursing notes.   HISTORY  Chief Complaint Shoulder Pain    HPI Ethan Dennis is a 23 y.o. male presents emergency department complaint of right shoulder pain.  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  He states 2 days ago he lifted motor and had pain on the right shoulder.  He continued to work.  Now he can hardly move the shoulder.  Hurts to reach overhead or come across his body.  He denies any numbness or tingling.  Denies neck pain.  He is right-handed    History reviewed. No pertinent past medical history.  There are no active problems to display for this patient.   History reviewed. No pertinent surgical history.  Prior to Admission medications   Medication Sig Start Date End Date Taking? Authorizing Provider  meloxicam (MOBIC) 15 MG tablet Take 1 tablet (15 mg total) by mouth daily. 08/15/19 08/14/20  Versie Starks, PA-C    Allergies Patient has no known allergies.  No family history on file.  Social History Social History   Tobacco Use  . Smoking status: Former Research scientist (life sciences)  . Smokeless tobacco: Never Used  Substance Use Topics  . Alcohol use: No  . Drug use: No    Review of Systems  Constitutional: No fever/chills Eyes: No visual changes. ENT: No sore throat. Respiratory: Denies cough Genitourinary: Negative for dysuria. Musculoskeletal: Negative for back pain.  Positive for right shoulder pain Skin: Negative for rash.    ____________________________________________   PHYSICAL EXAM:  VITAL SIGNS: ED Triage Vitals  Enc Vitals Group     BP 08/15/19 1825 (!) 154/96     Pulse Rate 08/15/19 1825 62     Resp 08/15/19 1825 18     Temp 08/15/19 1825 98.4 F (36.9 C)     Temp Source 08/15/19 1825 Oral     SpO2  08/15/19 1825 98 %     Weight 08/15/19 1824 215 lb (97.5 kg)     Height 08/15/19 1824 6' (1.829 m)     Head Circumference --      Peak Flow --      Pain Score 08/15/19 1824 10     Pain Loc --      Pain Edu? --      Excl. in [place]? --     Constitutional: Alert and oriented. Well appearing and in no acute distress. Eyes: Conjunctivae are normal.  Head: Atraumatic. Nose: No congestion/rhinnorhea. Mouth/Throat: Mucous membranes are moist.   Neck:  supple no lymphadenopathy noted Cardiovascular: Normal rate, regular rhythm.  Respiratory: Normal respiratory effort.  No retractions,  GU: deferred Musculoskeletal: Decreased range of motion of the right shoulder, the joint space at the right shoulder does appear to be swollen and tender, tender at the ACJ, tender at the's bursa of the scapula, decreased range of motion with overhead reach, internal rotation.   Neurologic:  Normal speech and language.  Skin:  Skin is warm, dry and intact. No rash noted. Psychiatric: Mood and affect are normal. Speech and behavior are normal.  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   Procedures    ____________________________________________   INITIAL IMPRESSION / ASSESSMENT AND PLAN / ED COURSE  Pertinent labs & imaging results that were available during my care of the patient were reviewed by me and considered in my medical decision making (see chart for details).   Patient is 23 year old male presents emergency department for right shoulder pain.  See HPI  Physical exam shows right shoulder to be tender.   Decreased range of motion.  Neurovascular intact  X-ray of the right shoulder Vicodin 1 p.o.   ----------------------------------------- 7:37 PM on 08/15/2019 -----------------------------------------  X-ray of the right shoulder is negative.  X-ray results were discussed with patient.  Is placed in a sling.  Given prescription for meloxicam.  Given a work note as he has the weekend off so he was off today.  He can return on Monday but with light duty for 3 days.  He is to follow-up with orthopedics if not improving at that time.  He was discharged stable condition.   Ethan Dennis was evaluated in Emergency Department on 08/15/2019 for the symptoms described in the history of present illness. He was evaluated in the context of the global COVID-19 pandemic, which necessitated consideration that the patient might be at risk for infection with the SARS-CoV-2 virus that causes COVID-19. Institutional protocols and algorithms that pertain to the evaluation of patients at risk for COVID-19 are in a state of rapid change based on information released by regulatory bodies including the CDC and federal and state organizations. These policies and algorithms were followed during the patient's care in the ED.   As part of my medical decision making, I reviewed the following data within the electronic MEDICAL RECORD NUMBER Nursing notes reviewed and incorporated, Old chart reviewed, Radiograph reviewed x-ray of the right shoulder is negative, Notes from prior ED visits and [place] Controlled Substance Database  ____________________________________________   FINAL CLINICAL IMPRESSION(S) / ED DIAGNOSES  Final diagnoses:  Acute pain of right shoulder      NEW MEDICATIONS STARTED DURING THIS VISIT:  New Prescriptions   MELOXICAM (MOBIC) 15 MG TABLET    Take 1 tablet (15 mg total) by mouth daily.     Note:  This document was prepared using Dragon voice recognition software and may include unintentional  dictation errors.    Faythe Ghee, PA-C 08/15/19 Joellyn Haff, MD 08/15/19 2252

## 2019-08-15 NOTE — Discharge Instructions (Signed)
Follow-up with orthopedics if not improving in 3 days.  Wear the sling and apply ice to the right shoulder.  Take the meloxicam as prescribed.  Return emergency department for worsening.

## 2019-09-16 ENCOUNTER — Ambulatory Visit: Payer: HRSA Program | Attending: Internal Medicine

## 2019-09-16 DIAGNOSIS — Z20828 Contact with and (suspected) exposure to other viral communicable diseases: Secondary | ICD-10-CM | POA: Diagnosis not present

## 2019-09-16 DIAGNOSIS — Z20822 Contact with and (suspected) exposure to covid-19: Secondary | ICD-10-CM

## 2019-09-17 LAB — NOVEL CORONAVIRUS, NAA: SARS-CoV-2, NAA: NOT DETECTED

## 2019-12-18 ENCOUNTER — Emergency Department: Payer: Worker's Compensation

## 2019-12-18 ENCOUNTER — Other Ambulatory Visit: Payer: Self-pay

## 2019-12-18 ENCOUNTER — Emergency Department
Admission: EM | Admit: 2019-12-18 | Discharge: 2019-12-18 | Disposition: A | Discharge status: Home / Self Care | Payer: Worker's Compensation | Attending: Emergency Medicine | Admitting: Emergency Medicine

## 2019-12-18 ENCOUNTER — Encounter: Payer: Self-pay | Admitting: Emergency Medicine

## 2019-12-18 DIAGNOSIS — Y990 Civilian activity done for income or pay: Secondary | ICD-10-CM | POA: Insufficient documentation

## 2019-12-18 DIAGNOSIS — Z87891 Personal history of nicotine dependence: Secondary | ICD-10-CM | POA: Insufficient documentation

## 2019-12-18 DIAGNOSIS — Y929 Unspecified place or not applicable: Secondary | ICD-10-CM | POA: Insufficient documentation

## 2019-12-18 DIAGNOSIS — S99912A Unspecified injury of left ankle, initial encounter: Secondary | ICD-10-CM | POA: Diagnosis present

## 2019-12-18 DIAGNOSIS — X501XXA Overexertion from prolonged static or awkward postures, initial encounter: Secondary | ICD-10-CM | POA: Insufficient documentation

## 2019-12-18 DIAGNOSIS — Y939 Activity, unspecified: Secondary | ICD-10-CM | POA: Diagnosis not present

## 2019-12-18 DIAGNOSIS — Z79899 Other long term (current) drug therapy: Secondary | ICD-10-CM | POA: Insufficient documentation

## 2019-12-18 DIAGNOSIS — S93402A Sprain of unspecified ligament of left ankle, initial encounter: Secondary | ICD-10-CM | POA: Insufficient documentation

## 2019-12-18 MED ORDER — NAPROXEN 500 MG PO TABS: 500.0000 mg | ORAL_TABLET | Freq: Two times a day (BID) | ORAL | 0 refills | Status: AC

## 2019-12-18 MED ORDER — HYDROCODONE-ACETAMINOPHEN 5-325 MG PO TABS
1.0000 | ORAL_TABLET | Freq: Once | ORAL | Status: AC
  Administered 2019-12-18: 13:00:00 1 via ORAL
  Filled 2019-12-18: qty 1

## 2019-12-18 MED ORDER — HYDROCODONE-ACETAMINOPHEN 5-325 MG PO TABS: 1.0000 | ORAL_TABLET | Freq: Four times a day (QID) | ORAL | 0 refills | Status: AC | PRN

## 2019-12-18 NOTE — ED Triage Notes (Signed)
Pt presents to ED via POV with c/o L ankle pain, pt states twisted ankle while at work, states does not want to file workers comp at this time.

## 2019-12-18 NOTE — ED Provider Notes (Signed)
[facility] Emergency Department Provider Note   ____________________________________________   First MD Initiated Contact with Patient 12/18/19 1301     (approximate)  I have reviewed the triage vital signs and the nursing notes.   HISTORY  Chief Complaint Ankle Pain   HPI Ethan Dennis is a 24 y.o. male presents to the ED after he twisted his ankle at work.  Patient states that he was coming down some steps and did not realize that there was one more step causing him to twist his ankle.  He denies any head injury or loss of consciousness.  He was unable to ambulate and was carried out of the building and brought to the ED. he rates his pain as an 8 out of 10.        History reviewed. No pertinent past medical history.  There are no problems to display for this patient.   History reviewed. No pertinent surgical history.  Prior to Admission medications   Medication Sig Start Date End Date Taking? Authorizing Provider  HYDROcodone-acetaminophen (NORCO/VICODIN) 5-325 MG tablet Take 1 tablet by mouth every 6 (six) hours as needed for moderate pain. 12/18/19   Johnn Hai, PA-C  meloxicam (MOBIC) 15 MG tablet Take 1 tablet (15 mg total) by mouth daily. 08/15/19 08/14/20  Caryn Section Linden Dolin, PA-C  naproxen (NAPROSYN) 500 MG tablet Take 1 tablet (500 mg total) by mouth 2 (two) times daily with a meal. 12/18/19   Johnn Hai, PA-C    Allergies Patient has no known allergies.  History reviewed. No pertinent family history.  Social History Social History   Tobacco Use  . Smoking status: Former Research scientist (life sciences)  . Smokeless tobacco: Never Used  Substance Use Topics  . Alcohol use: No  . Drug use: No    Review of Systems Constitutional: No fever/chills Cardiovascular: Denies chest pain. Respiratory: Denies shortness of breath. Gastrointestinal:  No nausea, no vomiting.  Musculoskeletal: Positive left ankle pain. Skin: Negative for  rash. Neurological: Negative for headaches, focal weakness or numbness. ____________________________________________   PHYSICAL EXAM:  VITAL SIGNS: ED Triage Vitals [12/18/19 1127]  Enc Vitals Group     BP (!) 161/94     Pulse Rate 83     Resp 20     Temp 98.4 F (36.9 C)     Temp Source Oral     SpO2 96 %     Weight 234 lb (106.1 kg)     Height 6' (1.829 m)     Head Circumference      Peak Flow      Pain Score 8     Pain Loc      Pain Edu?      Excl. in [place]?    Constitutional: Alert and oriented. Well appearing and in no acute distress. Eyes: Conjunctivae are normal.  Head: Atraumatic. Nose: No congestion/rhinnorhea. Neck: No stridor.   Cardiovascular: Normal rate, regular rhythm. Grossly normal heart sounds.  Good peripheral circulation. Respiratory: Normal respiratory effort.  No retractions. Lungs CTAB. Musculoskeletal: On the lateral aspect of the left ankle there is marked tenderness on palpation with soft tissue edema.  No gross deformity.  Patient range of motion is restricted secondary to pain.  Skin is intact and no ecchymosis or erythema was noted.  Pulses present.  Digits move with out any difficulty and motor sensory function intact. Neurologic:  Normal speech and language. No gross focal neurologic deficits are appreciated. No gait instability. Skin:  Skin  is warm, dry and intact.  Psychiatric: Mood and affect are normal. Speech and behavior are normal.  ____________________________________________   LABS (all labs ordered are listed, but only abnormal results are displayed)  Labs Reviewed - No data to display ____________________________________________ RADIOLOGY   Official radiology report(s): DG Ankle Complete Left  Result Date: 12/18/2019 CLINICAL DATA:  Pain for following twisting injury EXAM: LEFT ANKLE COMPLETE - 3+ VIEW COMPARISON:  None. FINDINGS: Frontal, oblique, and lateral views were obtained. There is soft tissue swelling. No evident  fracture or appreciable joint effusion. There is no joint space narrowing or erosion. Ankle mortise appears intact. IMPRESSION: Soft tissue swelling, most notably anteriorly. No appreciable fracture. No arthropathy. Ankle mortise appears intact. Electronically Signed   By: Bretta Bang III M.D.   On: 12/18/2019 11:55    ____________________________________________   PROCEDURES  Procedure(s) performed (including Critical Care):  Procedures Ankles splint and crutches were supplied to the patient by Chapman Moss, ED tech.  ____________________________________________   INITIAL IMPRESSION / ASSESSMENT AND PLAN / ED COURSE  As part of my medical decision making, I reviewed the following data within the electronic MEDICAL RECORD NUMBER Notes from prior ED visits and [place] Controlled Substance Database  24 year old male presents to the ED with complaint of left ankle pain with sudden onset when he twisted his ankle at work today.  Patient states he is not want to file Workmen's Comp.  He has been unable to bear weight since this happened.  There is soft tissue edema present on the lateral aspect with decreased range of motion secondary to pain.  X-rays were negative and reassuring to the patient.  Patient was placed in a ankle splint and given crutches.  Patient was discharged with a prescription for naproxen 500 mg twice daily with food and Norco as needed for pain.  He is instructed to ice and elevate to reduce swelling and help with pain.  He is to follow-up with Dr. Martha Clan if any continued problems with his ankle.  ____________________________________________   FINAL CLINICAL IMPRESSION(S) / ED DIAGNOSES  Final diagnoses:  Sprain of left ankle, unspecified ligament, initial encounter     ED Discharge Orders         Ordered    HYDROcodone-acetaminophen (NORCO/VICODIN) 5-325 MG tablet  Every 6 hours PRN     12/18/19 1312    naproxen (NAPROSYN) 500 MG tablet  2 times daily with meals      12/18/19 1312           Note:  This document was prepared using Dragon voice recognition software and may include unintentional dictation errors.    Tommi Rumps, PA-C 12/18/19 1536    Emily Filbert, MD 12/19/19 [phone]

## 2019-12-18 NOTE — Discharge Instructions (Signed)
Follow-up with Dr. Martha Clan who is the orthopedist on call if any continued problems or not improving.  Ice and elevation for the next 2 days to reduce swelling and help with pain.  Also a prescription for naproxen was sent to your pharmacy for inflammation.  Take this twice a day with food.  A prescription for pain medication was sent to your pharmacy as well.  You cannot take this medication and drive or operate machinery as it could cause drowsiness and increase your risk for injury.  Wear the ankle splint for added support and use crutches until you are able to bear weight without pain.

## 2019-12-23 ENCOUNTER — Emergency Department
Admission: EM | Admit: 2019-12-23 | Discharge: 2019-12-23 | Disposition: A | Discharge status: Home / Self Care | Payer: Worker's Compensation | Attending: Student in an Organized Health Care Education/Training Program | Admitting: Student in an Organized Health Care Education/Training Program

## 2019-12-23 ENCOUNTER — Other Ambulatory Visit: Payer: Self-pay

## 2019-12-23 ENCOUNTER — Encounter: Payer: Self-pay | Admitting: Emergency Medicine

## 2019-12-23 ENCOUNTER — Emergency Department: Payer: Worker's Compensation

## 2019-12-23 DIAGNOSIS — Y9289 Other specified places as the place of occurrence of the external cause: Secondary | ICD-10-CM | POA: Diagnosis not present

## 2019-12-23 DIAGNOSIS — Y9301 Activity, walking, marching and hiking: Secondary | ICD-10-CM | POA: Insufficient documentation

## 2019-12-23 DIAGNOSIS — Y990 Civilian activity done for income or pay: Secondary | ICD-10-CM | POA: Diagnosis not present

## 2019-12-23 DIAGNOSIS — I10 Essential (primary) hypertension: Secondary | ICD-10-CM

## 2019-12-23 DIAGNOSIS — S99912A Unspecified injury of left ankle, initial encounter: Secondary | ICD-10-CM | POA: Diagnosis present

## 2019-12-23 DIAGNOSIS — S93402A Sprain of unspecified ligament of left ankle, initial encounter: Secondary | ICD-10-CM | POA: Diagnosis not present

## 2019-12-23 DIAGNOSIS — Z87891 Personal history of nicotine dependence: Secondary | ICD-10-CM | POA: Diagnosis not present

## 2019-12-23 DIAGNOSIS — X509XXA Other and unspecified overexertion or strenuous movements or postures, initial encounter: Secondary | ICD-10-CM | POA: Diagnosis not present

## 2019-12-23 NOTE — ED Provider Notes (Signed)
[facility] Emergency Department Provider Note ____________________________________________  Time seen: Approximately 10:25 AM  I have reviewed the triage vital signs and the nursing notes.   HISTORY  Chief Complaint Ankle Pain    HPI Ethan Dennis is a 24 y.o. male who presents to the emergency department for evaluation and treatment of left ankle pain. He states that he was evaluated here last week after twisting his ankle. While at work, he was coming down the stairs and missed the last step. Ankle inverted and he has had pain and swelling since. He states that he has been wearing the splint and using crutches, but ankle is still swollen. No relief with medication either. He was unable to return to work today because he is unable to get his foot in the shoe he is required to wear.  History reviewed. No pertinent past medical history.  There are no problems to display for this patient.   History reviewed. No pertinent surgical history.  Prior to Admission medications   Medication Sig Start Date End Date Taking? Authorizing Provider  HYDROcodone-acetaminophen (NORCO/VICODIN) 5-325 MG tablet Take 1 tablet by mouth every 6 (six) hours as needed for moderate pain. 12/18/19   Tommi Rumps, PA-C  naproxen (NAPROSYN) 500 MG tablet Take 1 tablet (500 mg total) by mouth 2 (two) times daily with a meal. 12/18/19   Tommi Rumps, PA-C    Allergies Patient has no known allergies.  No family history on file.  Social History Social History   Tobacco Use  . Smoking status: Former Games developer  . Smokeless tobacco: Never Used  Substance Use Topics  . Alcohol use: No  . Drug use: No    Review of Systems Constitutional: Negative for fever. Cardiovascular: Negative for chest pain. Respiratory: Negative for shortness of breath. Musculoskeletal: Positive for left ankle pain. Skin: Positive for bruising to the left ankle.  Neurological: Negative for  decrease in sensation  ____________________________________________   PHYSICAL EXAM:  VITAL SIGNS: ED Triage Vitals  Enc Vitals Group     BP 12/23/19 0942 (!) 163/109     Pulse Rate 12/23/19 0942 70     Resp 12/23/19 0942 14     Temp 12/23/19 0942 98.3 F (36.8 C)     Temp Source 12/23/19 0942 Oral     SpO2 12/23/19 0942 98 %     Weight 12/23/19 0941 233 lb 15.9 oz (106.1 kg)     Height 12/23/19 0941 6' (1.829 m)     Head Circumference --      Peak Flow --      Pain Score 12/23/19 0940 10     Pain Loc --      Pain Edu? --      Excl. in [place]? --     Constitutional: Alert and oriented. Well appearing and in no acute distress. Eyes: Conjunctivae are clear without discharge or drainage Head: Atraumatic Neck: Supple Respiratory: No cough. Respirations are even and unlabored. Musculoskeletal: Diffuse left ankle swelling. Able to perform limited range of motion. Neurologic: Motor and sensory function is intact.  Skin: Ecchymosis to the lateral left ankle and foot.  Psychiatric: Affect and behavior are appropriate.  ____________________________________________   LABS (all labs ordered are listed, but only abnormal results are displayed)  Labs Reviewed - No data to display ____________________________________________  RADIOLOGY  Left ankle shows no bony abnormality. Small ankle effusion noted.  I, Kem Boroughs, personally viewed and evaluated these images (plain radiographs) as part  of my medical decision making, as well as reviewing the written report by the radiologist.  DG Ankle Complete Left  Result Date: 12/23/2019 CLINICAL DATA:  Left ankle pain and swelling since a twisting injury and fall 7 days ago. EXAM: LEFT ANKLE COMPLETE - 3+ VIEW COMPARISON:  Radiographs dated 12/18/2019 FINDINGS: There is no fracture or dislocation. Small ankle effusion. Soft tissue swelling around the ankle, increased laterally since the prior study. IMPRESSION: No acute bone abnormality.  Increased soft tissue swelling. Small ankle effusion. Electronically Signed   By: Lorriane Shire M.D.   On: 12/23/2019 10:48   ____________________________________________   PROCEDURES  Procedures  ____________________________________________   INITIAL IMPRESSION / ASSESSMENT AND PLAN / ED COURSE  Ethan Dennis is a 24 y.o. who presents to the emergency department for treatment and evaluation of left ankle pain. See HPI for details. Plan will be to re-image the ankle.   Image of the left ankle shows a small joint effusion. He will be referred to podiatry for follow up. While he says he has been using his crutches, he presented today without them. He was advised to remain non-weight bearing until follow up. He was also advised that his blood pressure is elevated. He denies chest pain, palpitations, dizziness, or blurred vision. He was strongly advised to see primary care for follow up. ER return precautions were discussed.  Medications - No data to display  Pertinent labs & imaging results that were available during my care of the patient were reviewed by me and considered in my medical decision making (see chart for details).   _________________________________________   FINAL CLINICAL IMPRESSION(S) / ED DIAGNOSES  Final diagnoses:  Sprain of left ankle, unspecified ligament, initial encounter  Hypertension, unspecified type    ED Discharge Orders    None       If controlled substance prescribed during this visit, 12 month history viewed on the [place] prior to issuing an initial prescription for Schedule II or III opiod.   Victorino Dike, FNP 12/23/19 1132    Merlyn Lot, MD 12/23/19 1320

## 2019-12-23 NOTE — ED Triage Notes (Signed)
Says he has been taking the medicine and it still hurts to walk on his left foot.  Injured last Tuesday and seen here.

## 2019-12-23 NOTE — Discharge Instructions (Addendum)
You need to use your crutches any time you are up walking around.  Please call and schedule an appointment with the podiatrist.  Rest, ice, and elevate your leg as much as possible throughout the day.  You need to have your blood pressure rechecked. Please establish primary care. If you have symptoms of concern, return to the ER.

## 2020-04-18 IMAGING — CR DG ANKLE COMPLETE 3+V*L*
3 series · 3 of 3 positions shown · non-contrast
Comparison: None.

CLINICAL DATA: Pain for following twisting injury

EXAM:
LEFT ANKLE COMPLETE - 3+ VIEW

[ankle ap]
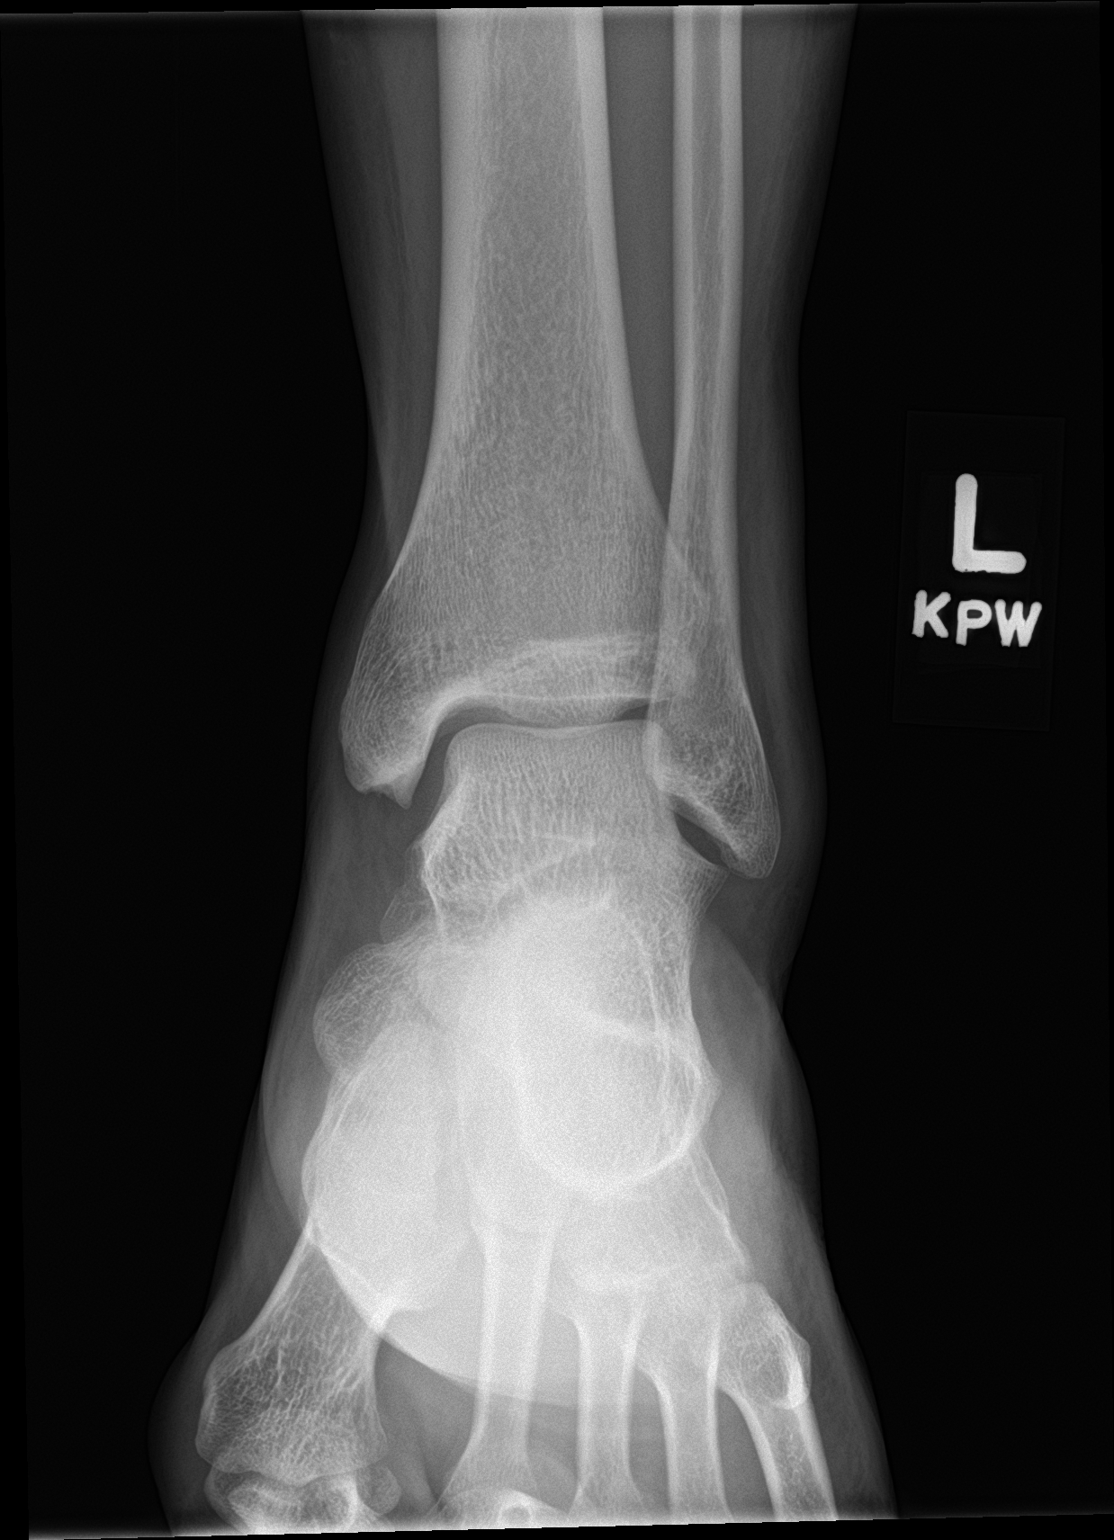

[ankle obl]
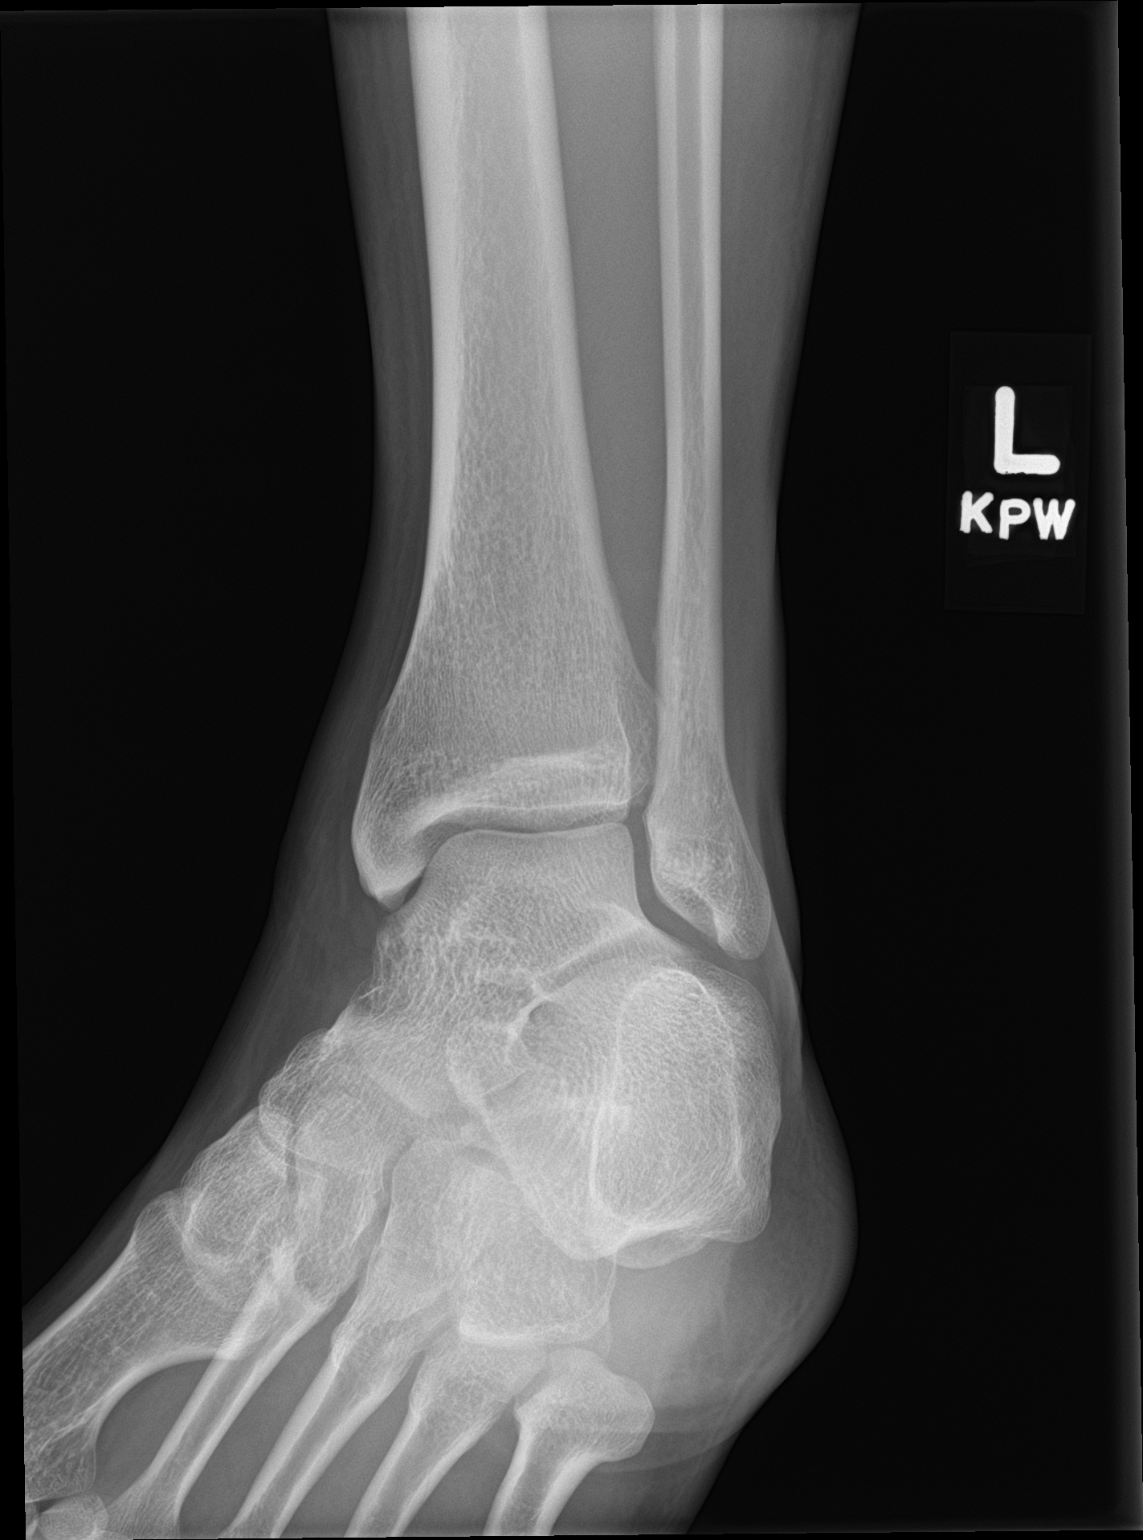

[ankle lat]
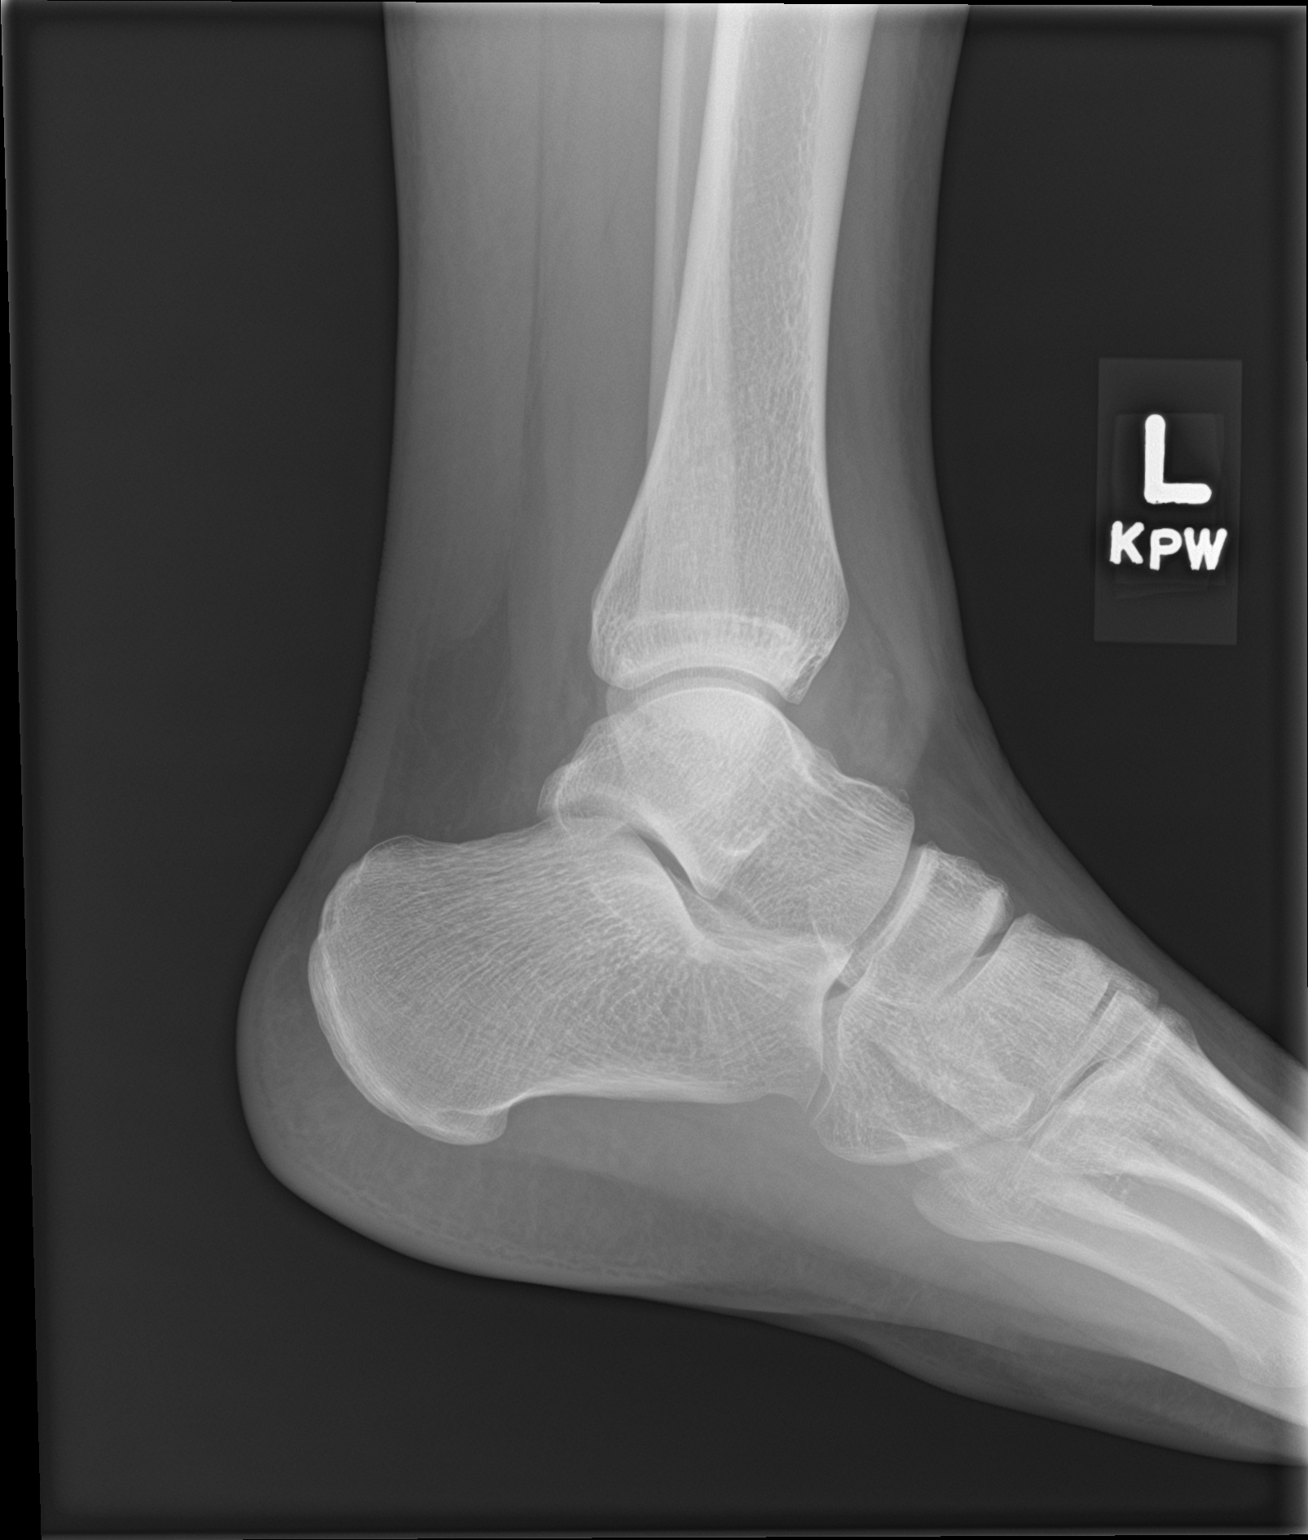

[3 of 3 positions shown; findings below may reference images not displayed]

FINDINGS: Frontal, oblique, and lateral views were obtained. There is soft
tissue swelling. No evident fracture or appreciable joint effusion.
There is no joint space narrowing or erosion. Ankle mortise appears
intact.
IMPRESSION: Soft tissue swelling, most notably anteriorly. No appreciable
fracture. No arthropathy. Ankle mortise appears intact.
